# Patient Record
Sex: Male | Born: 1975 | Race: Black or African American | Hispanic: No | Marital: Single | Smoking: Never smoker
Health system: Southern US, Community
[De-identification: ages and names within clinical notes are randomized; demographics above are authoritative.]

## PROBLEM LIST (undated history)

## (undated) HISTORY — PX: LYMPH NODE BIOPSY: SHX201

---

## 2019-05-04 ENCOUNTER — Other Ambulatory Visit: Payer: Self-pay

## 2019-05-04 ENCOUNTER — Encounter: Payer: Self-pay | Admitting: Emergency Medicine

## 2019-05-04 ENCOUNTER — Ambulatory Visit (INDEPENDENT_AMBULATORY_CARE_PROVIDER_SITE_OTHER): Payer: BC Managed Care – PPO

## 2019-05-04 ENCOUNTER — Ambulatory Visit
Admission: EM | Admit: 2019-05-04 | Discharge: 2019-05-04 | Disposition: A | Discharge status: Home / Self Care | Payer: BC Managed Care – PPO | Attending: Family Medicine | Admitting: Family Medicine

## 2019-05-04 DIAGNOSIS — W2209XA Striking against other stationary object, initial encounter: Secondary | ICD-10-CM

## 2019-05-04 DIAGNOSIS — S92514A Nondisplaced fracture of proximal phalanx of right lesser toe(s), initial encounter for closed fracture: Secondary | ICD-10-CM

## 2019-05-04 DIAGNOSIS — M79674 Pain in right toe(s): Secondary | ICD-10-CM | POA: Diagnosis not present

## 2019-05-04 MED ORDER — IBUPROFEN 800 MG PO TABS: 800.0000 mg | ORAL_TABLET | Freq: Three times a day (TID) | ORAL | 0 refills | Status: AC | PRN

## 2019-05-04 NOTE — ED Provider Notes (Signed)
[address], [address] [place], [place] [address] [phone]   Name: Tony Morton DOB: [birth year] MRN: [id number] CSN: [id number] PCP: Patient, No Pcp Per  Arrival date and time:  05/04/19 1132  Chief Complaint:  Toe Pain (APPT right pinky toe)  NOTE: Prior to seeing the patient today, I have reviewed the triage nursing documentation and vital signs. Clinical staff has updated patient's PMH/PSHx, current medication list, and drug allergies/intolerances to ensure comprehensive history available to assist in medical decision making.   History:   HPI: Tony Morton is a 43 y.o. male who presents today with complaints of pain in the fifth digit of his RIGHT foot.  Patient notes that he was walking his dog 2 days ago and he struck his foot on the railing.  Patient presents with pain and swelling to the affected toe.  Minimal bruising noted. Pain is exacerbated by weightbearing and ambulation. Patient has been using Tylenol at home to help with his pain.  History reviewed. No pertinent past medical history.  Past Surgical History:  Procedure Laterality Date  . LYMPH NODE BIOPSY      Family History  Problem Relation Age of Onset  . Healthy Mother   . Cancer Father     Social History   Socioeconomic History  . Marital status: Single    Spouse name: Not on file  . Number of children: Not on file  . Years of education: Not on file  . Highest education level: Not on file  Occupational History  . Not on file  Social Needs  . Financial resource strain: Not on file  . Food insecurity:    Worry: Not on file    Inability: Not on file  . Transportation needs:    Medical: Not on file    Non-medical: Not on file  Tobacco Use  . Smoking status: Never Smoker  . Smokeless tobacco: Never Used  Substance and Sexual Activity  . Alcohol use: Not Currently  . Drug use: Not Currently  . Sexual activity: Not on file  Lifestyle  . Physical activity:    Days per week: Not on file   Minutes per session: Not on file  . Stress: Not on file  Relationships  . Social connections:    Talks on phone: Not on file    Gets together: Not on file    Attends religious service: Not on file    Active member of club or organization: Not on file    Attends meetings of clubs or organizations: Not on file    Relationship status: Not on file  . Intimate partner violence:    Fear of current or ex partner: Not on file    Emotionally abused: Not on file    Physically abused: Not on file    Forced sexual activity: Not on file  Other Topics Concern  . Not on file  Social History Narrative  . Not on file    There are no active problems to display for this patient.   Home Medications:    No outpatient medications have been marked as taking for the 05/04/19 encounter [facility](Hospital Encounter).    Allergies:   Patient has no known allergies.  Review of Systems (ROS): Review of Systems  Constitutional: Negative for chills and fever.  Respiratory: Negative for cough and shortness of breath.   Cardiovascular: Negative for chest pain and palpitations.  Musculoskeletal:       Pain and swelling to fifth digit on RIGHT foot (pinky toe)  Neurological: Negative for weakness and numbness.     Physical Exam:  Triage Vital Signs ED Triage Vitals  Enc Vitals Group     BP 05/04/19 1153 135/85     Pulse Rate 05/04/19 1153 64     Resp 05/04/19 1153 18     Temp 05/04/19 1153 98.6 F (37 C)     Temp Source 05/04/19 1153 Oral     SpO2 05/04/19 1153 100 %     Weight 05/04/19 1150 240 lb (108.9 kg)     Height 05/04/19 1150 6\' 1"  (1.854 m)     Head Circumference --      Peak Flow --      Pain Score 05/04/19 1150 6     Pain Loc --      Pain Edu? --      Excl. in [place]? --     Physical Exam  Constitutional: He is oriented to person, place, and time and well-developed, well-nourished, and in no distress.  HENT:  Head: Normocephalic and atraumatic.  Mouth/Throat: Oropharynx is clear and  moist and mucous membranes are normal.  Cardiovascular: Normal rate, regular rhythm, normal heart sounds and intact distal pulses. Exam reveals no gallop and no friction rub.  No murmur heard. Pulmonary/Chest: Effort normal and breath sounds normal. No respiratory distress. He has no wheezes. He has no rales.  Musculoskeletal:     Right foot: Decreased range of motion (5th digit). Normal capillary refill. Tenderness (5th digit) and swelling (5th digit) present. No crepitus or deformity.  Neurological: He is alert and oriented to person, place, and time.  Skin: Skin is warm and dry. No rash noted. No erythema.  Psychiatric: Mood, affect and judgment normal.  Nursing note and vitals reviewed.    Urgent Care Treatments / Results:   LABS: PLEASE NOTE: all labs that were ordered this encounter are listed, however only abnormal results are displayed. Labs Reviewed - No data to display  EKG: -None  RADIOLOGY: Dg Toe 5th Right  Result Date: 05/04/2019 CLINICAL DATA:  Stubbed toe 2 days ago. EXAM: RIGHT FIFTH TOE COMPARISON:  None. FINDINGS: There is a spiral fracture of the proximal phalanx of the fifth toe, with soft tissue swelling. The fracture is nondisplaced. There is no definite extension to the joint space. The fracture is better visualized on the oblique view. IMPRESSION: Spiral fracture proximal phalanx fifth toe. Electronically Signed   By: Staci Righter M.D.   On: 05/04/2019 12:15    PROCEDURES: Procedures  MEDICATIONS RECEIVED THIS VISIT: Medications - No data to display  PERTINENT CLINICAL COURSE NOTES/UPDATES:   Initial Impression / Assessment and Plan / Urgent Care Course:    Tony Morton is a 44 y.o. male who presents to [facility] today with complaints of Toe Pain (APPT right pinky toe)  Pertinent labs & imaging results that were available during my care of the patient were personally reviewed by me and considered in my medical decision making (see lab/imaging  section of note for values and interpretations).  Patient overall well appearing and in no acute distress today in clinic. Exam reveals pain and swelling to the fifth digit of patient's RIGHT status post blunt force injury (stubbed toe).  Diagnostic plain films show a nondisplaced spiral fracture of the proximal phalanx.  Fourth and fifth digits buddy taped, and patient placed in a postoperative shoe.  Discussed typical clinical course associated with these types of fractures.  He was given the name of a local podiatrist (  Troxler, MD) to follow-up with in the case of any issues.  Discussed pain control.  Patient refused medication for his pain citing that his pain was "not that bad".  Will prescribe ibuprofen 800 mg 3 times daily PRN.  Patient encouraged to use ice and elevate foot to help with swelling.   Current clinical condition warrants patient being out of work in order to recover from his current injury/illness. He was provided with the appropriate documentation to provide to his place of employment that will allow for him to RTW on 05/10/2019 with no restrictions.   Discussed follow up with primary care physician in 1 week for re-evaluation. I have reviewed the follow up and strict return precautions for any new or worsening symptoms. Patient is aware of symptoms that would be deemed urgent/emergent, and would thus require further evaluation either here or in the emergency department. At the time of discharge, he verbalized understanding and consent with the discharge plan as it was reviewed with him. All questions were fielded by provider and/or clinic staff prior to patient discharge.    Final Clinical Impressions(s) / Urgent Care Diagnoses:   Final diagnoses:  Nondisplaced fracture of proximal phalanx of right lesser toe(s), initial encounter for closed fracture    New Prescriptions:   Meds ordered this encounter  Medications  . ibuprofen (ADVIL) 800 MG tablet    Sig: Take 1 tablet (800  mg total) by mouth every 8 (eight) hours as needed.    Dispense:  21 tablet    Refill:  0    Controlled Substance Prescriptions:  [place] Controlled Substance Registry consulted? Not Applicable  NOTE: This note was prepared using Dragon dictation software along with smaller phrase technology. Despite my best ability to proofread, there is the potential that transcriptional errors may still occur from this process, and are completely unintentional.     Verlee MonteGray, [place] E, NP 05/05/19 1610

## 2019-05-04 NOTE — Discharge Instructions (Addendum)
It was very nice seeing you today in clinic. Thank you for entrusting me with your care.   As discussed, your toe is fractured. Wear post op shoe, use ice, and take the anti-inflammatories as needed.   Make arrangements to follow up with orthopedics within the next 1 week for re-evaluation. I have provided you the name and office contact information for an excellent local provider. If your symptoms/condition worsens, please seek follow up care either here or in the ER. Please remember, our [place] providers are "right here with you" when you need [place].   Again, it was my pleasure to take care of you today. Thank you for choosing our clinic. I hope that you start to feel better quickly.   Honor Loh, MSN, APRN, FNP-C, CEN Advanced Practice Provider [facility]

## 2019-05-04 NOTE — ED Triage Notes (Signed)
Pt c/o right pinky toe. He states that his hit on railing when walking his dog about 2 days ago.

## 2020-06-21 IMAGING — CR RIGHT FIFTH TOE
3 series · 3 of 3 positions shown · non-contrast
Comparison: None.

CLINICAL DATA: Stubbed toe 2 days ago.

EXAM:
RIGHT FIFTH TOE

[toe ap]
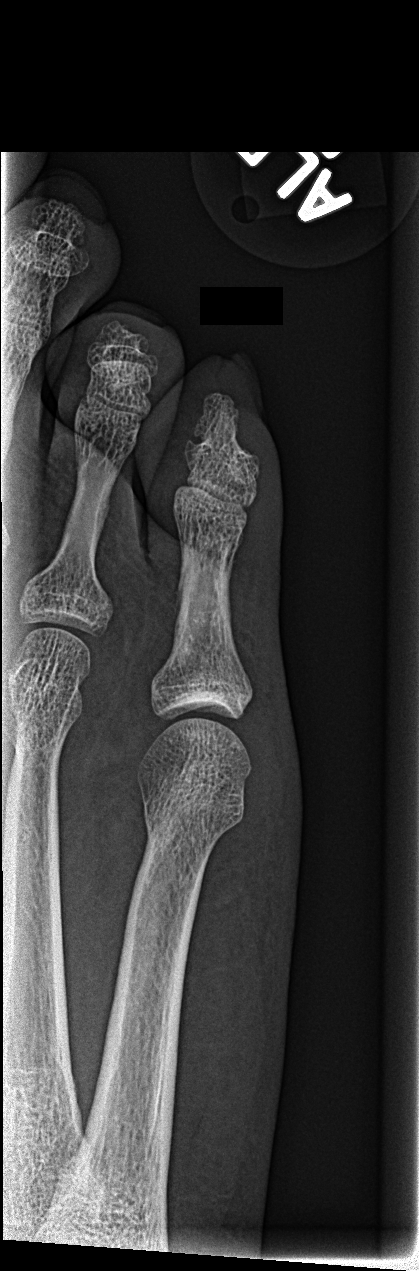

[toe obl]
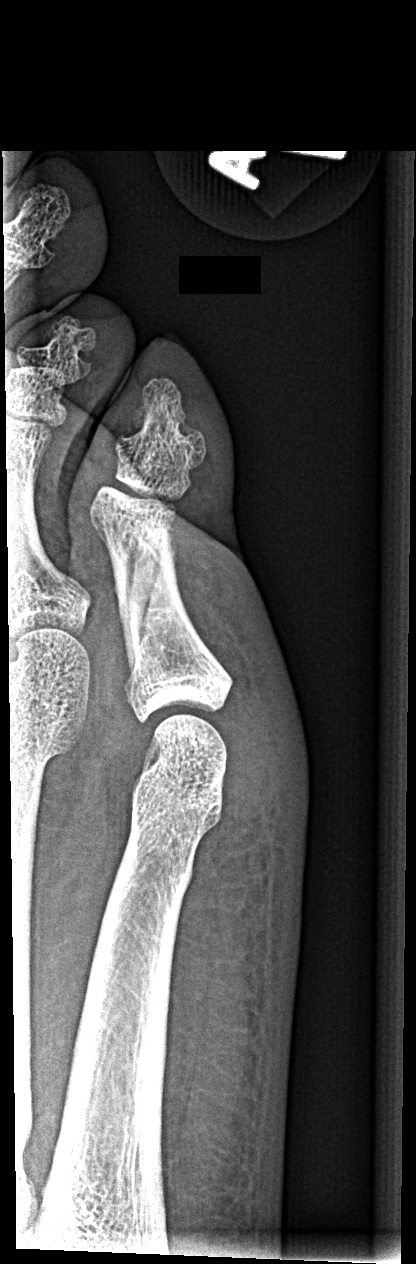

[toe lat]
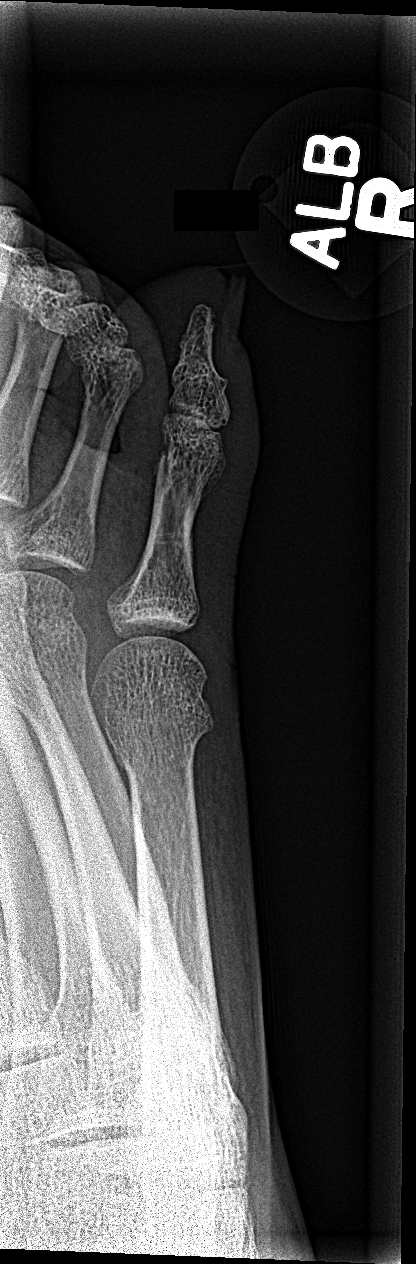

[3 of 3 positions shown; findings below may reference images not displayed]

FINDINGS: There is a spiral fracture of the proximal phalanx of the fifth toe,
with soft tissue swelling. The fracture is nondisplaced. There is no
definite extension to the joint space. The fracture is better
visualized on the oblique view.
IMPRESSION: Spiral fracture proximal phalanx fifth toe.
